# Patient Record
Sex: Male | Born: 1960 | Race: Black or African American | Hispanic: No | Marital: Single | Smoking: Current every day smoker
Health system: Southern US, Community
[De-identification: ages and names within clinical notes are randomized; demographics above are authoritative.]

## PROBLEM LIST (undated history)

## (undated) DIAGNOSIS — I10 Essential (primary) hypertension: Secondary | ICD-10-CM

## (undated) DIAGNOSIS — E785 Hyperlipidemia, unspecified: Secondary | ICD-10-CM

---

## 2016-08-01 ENCOUNTER — Telehealth: Payer: Self-pay

## 2016-08-01 NOTE — Telephone Encounter (Signed)
See separate note.

## 2016-08-01 NOTE — Telephone Encounter (Signed)
Pt received triage letter from [facility] and isn't having any GI issues, no blood thinners or history of heart attacks. Please call [phone]

## 2016-08-01 NOTE — Telephone Encounter (Signed)
[phone] RECEIVED LETTER TO SCHEDULE TCS

## 2016-08-02 ENCOUNTER — Telehealth: Payer: Self-pay

## 2016-08-02 NOTE — Telephone Encounter (Signed)
LMOM to call.

## 2016-08-02 NOTE — Telephone Encounter (Signed)
See separate triage.  

## 2016-08-08 NOTE — Telephone Encounter (Signed)
Gastroenterology Pre-Procedure Review  Request Date: 08/02/2016 Requesting Physician: Barry Dienes FNP  PATIENT REVIEW QUESTIONS: The patient responded to the following health history questions as indicated:    1. Diabetes Melitis: no 2. Joint replacements in the past 12 months: no 3. Major health problems in the past 3 months: no 4. Has an artificial valve or MVP: no 5. Has a defibrillator: no 6. Has been advised in past to take antibiotics in advance of a procedure like teeth cleaning: no 7. Family history of colon cancer: no  8. Alcohol Use: YES    6 pack a week 9. History of sleep apnea: no  10. History of coronary artery or other vascular stents placed within the last 12 months: no    MEDICATIONS & ALLERGIES:    Patient reports the following regarding taking any blood thinners:   Plavix? no Aspirin? no Coumadin? no Brilinta? no Xarelto? no Eliquis? no Pradaxa? no Savaysa? no Effient? no  Patient confirms/reports the following medications:  Current Outpatient Prescriptions  Medication Sig Dispense Refill  . losartan (COZAAR) 100 MG tablet Take 100 mg by mouth daily.    . rosuvastatin (CRESTOR) 10 MG tablet Take 10 mg by mouth daily.     No current facility-administered medications for this visit.     Patient confirms/reports the following allergies:  No Known Allergies  No orders of the defined types were placed in this encounter.   AUTHORIZATION INFORMATION Primary Insurance:   ID #: Group #:  Pre-Cert / Auth required:  Pre-Cert / Auth #:   Secondary Insurance:   ID #:   Group #:  Pre-Cert / Auth required:  Pre-Cert / Auth #:   SCHEDULE INFORMATION: Procedure has been scheduled as follows:  Date:  09/20/2016                Time:  10:30 AM Location: [facility] Short Stay  This Gastroenterology Pre-Precedure Review Form is being routed to the following provider(s): Barney Drain, MD

## 2016-08-08 NOTE — Telephone Encounter (Signed)
Cleanpiq SAMPLE-FULL LIQUIDS WITH BREAKFAST.  Full Liquid Diet A high-calorie, high-protein supplement should be used to meet your nutritional requirements when the full liquid diet is continued for more than 2 or 3 days. If this diet is to be used for an extended period of time (more than 7 days), a multivitamin should be considered.  Breads and Starches  Allowed: None are allowed   Avoid: Any others.    Potatoes/Pasta/Rice  Allowed: ANY ITEM AS A SOUP OR SMALL PLATE OF MASHED POTATOES OR SCRAMBLED EGGS. (DO NOT EAT MORE THAN ONE SERVING ON THE DAY BEFORE COLONOSCOPY).      Vegetables  Allowed: Strained tomato or vegetable juice. Vegetables pureed in soup.   Avoid: Any others.    Fruit  Allowed: Any strained fruit juices and fruit drinks. Include 1 serving of citrus or vitamin C-enriched fruit juice daily.   Avoid: Any others.  Meat and Meat Substitutes  Allowed: Egg  Avoid: Any meat, fish, or fowl. All cheese.  Milk  Allowed: SOY Milk beverages, including milk shakes and instant breakfast mixes. Smooth yogurt.   Avoid: Any others. Avoid dairy products if not tolerated.    Soups and Combination Foods  Allowed: Broth, strained cream soups. Strained, broth-based soups.   Avoid: Any others.    Desserts and Sweets  Allowed: flavored gelatin, tapioca, ice cream, sherbet, smooth pudding, junket, fruit ices, frozen ice pops, pudding pops, frozen fudge pops, chocolate syrup. Sugar, honey, jelly, syrup.   Avoid: Any others.  Fats and Oils  Allowed: Margarine, butter, cream, sour cream, oils.   Avoid: Any others.  Beverages  Allowed: All.   Avoid: None.  Condiments  Allowed: Iodized salt, pepper, spices, flavorings. Cocoa powder.   Avoid: Any others.    SAMPLE MEAL PLAN Breakfast   cup orange juice.   1 OR 2 EGGS  1 cup milk.   1 cup beverage (coffee or tea).   Cream or sugar, if desired.    Midmorning Snack  2 SCRAMBLED OR HARD BOILED  EGG   Lunch  1 cup cream soup.    cup fruit juice.   1 cup milk.    cup custard.   1 cup beverage (coffee or tea).   Cream or sugar, if desired.    Midafternoon Snack  1 cup milk shake.  Dinner  1 cup cream soup.    cup fruit juice.   1 cup MILK    cup pudding.   1 cup beverage (coffee or tea).   Cream or sugar, if desired.  Evening Snack  1 cup supplement.  To increase calories, add sugar, cream, butter, or margarine if possible. Nutritional supplements will also increase the total calories.

## 2016-08-15 ENCOUNTER — Other Ambulatory Visit: Payer: Self-pay

## 2016-08-15 DIAGNOSIS — Z1211 Encounter for screening for malignant neoplasm of colon: Secondary | ICD-10-CM

## 2016-08-15 NOTE — Telephone Encounter (Signed)
Sample and instructions ready for pt.

## 2016-08-15 NOTE — Telephone Encounter (Signed)
Pt is aware and will pick up the prep and instructions here. They are at front for him when he comes.

## 2016-09-20 ENCOUNTER — Encounter (HOSPITAL_COMMUNITY)
Admission: RE | Disposition: A | Discharge status: Home / Self Care | Payer: Self-pay | Source: Ambulatory Visit | Attending: Gastroenterology

## 2016-09-20 ENCOUNTER — Encounter (HOSPITAL_COMMUNITY): Payer: Self-pay | Admitting: *Deleted

## 2016-09-20 ENCOUNTER — Ambulatory Visit (HOSPITAL_COMMUNITY)
Admission: RE | Admit: 2016-09-20 | Discharge: 2016-09-20 | Disposition: A | Discharge status: Home / Self Care | Payer: BLUE CROSS/BLUE SHIELD | Source: Ambulatory Visit | Attending: Gastroenterology | Admitting: Gastroenterology

## 2016-09-20 DIAGNOSIS — D128 Benign neoplasm of rectum: Secondary | ICD-10-CM | POA: Diagnosis not present

## 2016-09-20 DIAGNOSIS — D124 Benign neoplasm of descending colon: Secondary | ICD-10-CM | POA: Insufficient documentation

## 2016-09-20 DIAGNOSIS — D123 Benign neoplasm of transverse colon: Secondary | ICD-10-CM | POA: Insufficient documentation

## 2016-09-20 DIAGNOSIS — F17210 Nicotine dependence, cigarettes, uncomplicated: Secondary | ICD-10-CM | POA: Diagnosis not present

## 2016-09-20 DIAGNOSIS — D125 Benign neoplasm of sigmoid colon: Secondary | ICD-10-CM | POA: Insufficient documentation

## 2016-09-20 DIAGNOSIS — Z1212 Encounter for screening for malignant neoplasm of rectum: Secondary | ICD-10-CM | POA: Diagnosis not present

## 2016-09-20 DIAGNOSIS — K644 Residual hemorrhoidal skin tags: Secondary | ICD-10-CM | POA: Insufficient documentation

## 2016-09-20 DIAGNOSIS — K648 Other hemorrhoids: Secondary | ICD-10-CM | POA: Diagnosis not present

## 2016-09-20 DIAGNOSIS — E785 Hyperlipidemia, unspecified: Secondary | ICD-10-CM | POA: Insufficient documentation

## 2016-09-20 DIAGNOSIS — Z1211 Encounter for screening for malignant neoplasm of colon: Secondary | ICD-10-CM | POA: Diagnosis not present

## 2016-09-20 DIAGNOSIS — D122 Benign neoplasm of ascending colon: Secondary | ICD-10-CM | POA: Diagnosis not present

## 2016-09-20 DIAGNOSIS — Z79899 Other long term (current) drug therapy: Secondary | ICD-10-CM | POA: Insufficient documentation

## 2016-09-20 DIAGNOSIS — I10 Essential (primary) hypertension: Secondary | ICD-10-CM | POA: Insufficient documentation

## 2016-09-20 HISTORY — PX: POLYPECTOMY: SHX5525

## 2016-09-20 HISTORY — DX: Essential (primary) hypertension: I10

## 2016-09-20 HISTORY — DX: Hyperlipidemia, unspecified: E78.5

## 2016-09-20 HISTORY — PX: COLONOSCOPY: SHX5424

## 2016-09-20 SURGERY — COLONOSCOPY
Anesthesia: Moderate Sedation

## 2016-09-20 MED ORDER — MEPERIDINE HCL 100 MG/ML IJ SOLN
INTRAMUSCULAR | Status: DC | PRN
  Administered 2016-09-20 (×3): 25 mg via INTRAVENOUS

## 2016-09-20 MED ORDER — STERILE WATER FOR IRRIGATION IR SOLN
Status: DC | PRN
  Administered 2016-09-20: 11:00:00

## 2016-09-20 MED ORDER — MIDAZOLAM HCL 5 MG/5ML IJ SOLN
INTRAMUSCULAR | Status: AC
  Filled 2016-09-20: qty 10

## 2016-09-20 MED ORDER — MIDAZOLAM HCL 5 MG/5ML IJ SOLN
INTRAMUSCULAR | Status: DC | PRN
  Administered 2016-09-20: 2 mg via INTRAVENOUS
  Administered 2016-09-20: 1 mg via INTRAVENOUS
  Administered 2016-09-20: 2 mg via INTRAVENOUS

## 2016-09-20 MED ORDER — MEPERIDINE HCL 100 MG/ML IJ SOLN
INTRAMUSCULAR | Status: DC
Start: 2016-09-20 — End: 2016-09-20
  Filled 2016-09-20: qty 2

## 2016-09-20 MED ORDER — SODIUM CHLORIDE 0.9 % IV SOLN
INTRAVENOUS | Status: DC
  Administered 2016-09-20: 1000 mL via INTRAVENOUS

## 2016-09-20 NOTE — Op Note (Signed)
[facility],The Patient Name: Ronnie Chapman Procedure Date: 09/20/2016 10:14 AM MRN: [id number] Date of Birth: [birth year] Attending MD: Barney Drain , MD CSN: [id number] Age: 56 Admit Type: Outpatient Procedure:                Colonoscopy with SNARE CAUTERY POLYPECTOMY Indications:              Screening for colorectal malignant neoplasm Providers:                Barney Drain, MD, Otis Peak B. Gwenlyn Perking RN, RN, Aram Candela Referring MD:              Medicines:                Meperidine 75 mg IV, Midazolam 5 mg IV Complications:            No immediate complications. Estimated Blood Loss:     Estimated blood loss: none. Procedure:                Pre-Anesthesia Assessment:                           - Prior to the procedure, a History and Physical                            was performed, and patient medications and                            allergies were reviewed. The patient's tolerance of                            previous anesthesia was also reviewed. The risks                            and benefits of the procedure and the sedation                            options and risks were discussed with the patient.                            All questions were answered, and informed consent                            was obtained. Prior Anticoagulants: The patient has                            taken no previous anticoagulant or antiplatelet                            agents. ASA Grade Assessment: II - A patient with                            mild systemic disease. After reviewing the risks  and benefits, the patient was deemed in                            satisfactory condition to undergo the procedure.                            After obtaining informed consent, the colonoscope                            was passed under direct vision. Throughout the                            procedure, the patient's blood pressure, pulse, and                       oxygen saturations were monitored continuously. The                            [id number]) was introduced through the anus                            and advanced to the the cecum, identified by                            appendiceal orifice and ileocecal valve. The                            colonoscopy was technically difficult and complex                            due to a tortuous colon. Successful completion of                            the procedure was aided by COLOWRAP. The patient                            tolerated the procedure well. The quality of the                            bowel preparation was good. The ileocecal valve,                            appendiceal orifice, and rectum were photographed. Scope In: 10:43:24 AM Scope Out: 11:02:49 AM Scope Withdrawal Time: 0 hours 17 minutes 35 seconds  Total Procedure Duration: 0 hours 19 minutes 25 seconds  Findings:      Six sessile polyps were found in the rectum, sigmoid colon, descending       colon, distal transverse colon and ascending colon. The polyps were 4 to       6 mm in size. These polyps were removed with a hot snare. Resection and       retrieval were complete.      A 10 mm polyp was found in the proximal transverse colon. The polyp was       sessile. The polyp was removed with a piecemeal  technique using a hot       snare. Resection and retrieval were complete.      External and internal hemorrhoids were found during retroflexion. The       hemorrhoids were small. Impression:               - Six 4 to 6 mm polyps in the rectum, in the                            sigmoid colon, in the descending colon, in the                            distal transverse colon and in the ascending colon,                            removed with a hot snare. Resected and retrieved.                           - One 10 mm polyp in the proximal transverse colon,                            removed piecemeal  using a hot snare. Resected and                            retrieved.                           - External and internal hemorrhoids. Moderate Sedation:      Moderate (conscious) sedation was administered by the endoscopy nurse       and supervised by the endoscopist. The following parameters were       monitored: oxygen saturation, heart rate, blood pressure, and response       to care. Total physician intraservice time was 29 minutes. Recommendation:           - Repeat colonoscopy 1 YEAR IF ADVANCED IN pTC AND                            3 YEARS IF SIMPLE ADENOMAS for surveillance.                           - High fiber diet.                           - Continue present medications.                           - Await pathology results.                           - Patient has a contact number available for                            emergencies. The signs and symptoms of potential  delayed complications were discussed with the                            patient. Return to normal activities tomorrow.                            Written discharge instructions were provided to the                            patient. Procedure Code(s):        --- Professional ---                           [phone], Colonoscopy, flexible; with removal of                            tumor(s), polyp(s), or other lesion(s) by snare                            technique                           99152, Moderate sedation services provided by the                            same physician or other qualified health care                            professional performing the diagnostic or                            therapeutic service that the sedation supports,                            requiring the presence of an independent trained                            observer to assist in the monitoring of the                            patient's level of consciousness and physiological                             status; initial 15 minutes of intraservice time,                            patient age 42 years or older                           [phone], Moderate sedation services; each additional                            15 minutes intraservice time Diagnosis Code(s):        --- Professional ---  Z12.11, Encounter for screening for malignant                            neoplasm of colon                           K62.1, Rectal polyp                           D12.5, Benign neoplasm of sigmoid colon                           D12.4, Benign neoplasm of descending colon                           D12.3, Benign neoplasm of transverse colon (hepatic                            flexure or splenic flexure)                           D12.2, Benign neoplasm of ascending colon                           K64.8, Other hemorrhoids CPT copyright 2016 [employer]. All rights reserved. The codes documented in this report are preliminary and upon coder review may  be revised to meet current compliance requirements. Barney Drain, MD Barney Drain, MD 09/20/2016 11:18:52 AM This report has been signed electronically. Number of Addenda: 0

## 2016-09-20 NOTE — H&P (Signed)
  Primary Care Physician:  Patient, No Pcp Per Primary Gastroenterologist:  Dr. Oneida Alar  Pre-Procedure History & Physical: HPI:  Ronnie Chapman is a 56 y.o. male here for COLON CANCER SCREENING.  Past Medical History:  Diagnosis Date  . Hyperlipidemia   . Hypertension     History reviewed. No pertinent surgical history.  Prior to Admission medications   Medication Sig Start Date End Date Taking? Authorizing Provider  amLODipine (NORVASC) 10 MG tablet Take 10 mg by mouth daily. 08/11/16  Yes [provider]  losartan (COZAAR) 100 MG tablet Take 100 mg by mouth daily.   Yes [provider]  rosuvastatin (CRESTOR) 10 MG tablet Take 10 mg by mouth daily.   Yes [provider]    Allergies as of 08/15/2016  . (No Known Allergies)    Family History  Problem Relation Age of Onset  . Hyperlipidemia Mother     Social History   Social History  . Marital status: Single    Spouse name: N/A  . Number of children: N/A  . Years of education: N/A   Occupational History  . Not on file.   Social History Main Topics  . Smoking status: Current Every Day Smoker    Packs/day: 0.50    Years: 30.00    Types: Cigarettes  . Smokeless tobacco: Never Used  . Alcohol use Yes     Comment: drinks beer and Gin - once a week  . Drug use: No  . Sexual activity: Not on file   Other Topics Concern  . Not on file   Social History Narrative  . No narrative on file    Review of Systems: See HPI, otherwise negative ROS   Physical Exam: BP (!) 161/100   Pulse 81   Temp 98.6 F (37 C) (Oral)   Resp 16   Ht 6' (1.829 m)   Wt 240 lb (108.9 kg)   SpO2 98%   BMI 32.55 kg/m  General:   Alert,  pleasant and cooperative in NAD Head:  Normocephalic and atraumatic. Neck:  Supple; Lungs:  Clear throughout to auscultation.    Heart:  Regular rate and rhythm. Abdomen:  Soft, nontender and nondistended. Normal bowel sounds, without guarding, and without rebound.    Neurologic:  Alert and  oriented x4;  grossly normal neurologically.  Impression/Plan:     SCREENING  Plan:  1. TCS TODAY. DISCUSSED PROCEDURE, BENEFITS, & RISKS: < 1% chance of medication reaction, bleeding, perforation, or rupture of spleen/liver.

## 2016-09-20 NOTE — Discharge Instructions (Signed)
You had 7 polyps removed. You have internal hemorrhoids.   CONTINUE YOUR WEIGHT LOSS EFFORTS.WHILE I DO NOT WANT TO ALARM YOU, YOUR BODY MASS INDEX IS OVER 30 WHICH MEANS YOU ARE OBESE. OBESITY ACTIVATES CANCER GENES. OBESITY IS ASSOCIATED WITH AN INCREASE RISK FOR ALL CANCERS, INCLUDING ESOPHAGEAL AND COLON CANCER.  YOU SHOULD LOSE TWENTY POUNDS. YOUR GOAL WEIGHT SHOULD BE 220 LBS.   DRINK WATER TO KEEP YOUR URINE LIGHT YELLOW.  FOLLOW A HIGH FIBER DIET. AVOID ITEMS THAT CAUSE BLOATING & GAS. SEE INFO BELOW.  YOUR BIOPSY RESULTS WILL BE AVAILABLE IN MY CHART AFTER JUL 1 AND MY OFFICE WILL CONTACT YOU IN 10-14 DAYS WITH YOUR RESULTS.   Next colonoscopy in 1-3 years.  YOUR SISTERS, BROTHERS, CHILDREN, AND PARENTS NEED TO HAVE A COLONOSCOPY STARTING AT THE AGE OF 40.    Colonoscopy Care After Read the instructions outlined below and refer to this sheet in the next week. These discharge instructions provide you with general information on caring for yourself after you leave the hospital. While your treatment has been planned according to the most current medical practices available, unavoidable complications occasionally occur. If you have any problems or questions after discharge, call DR. Sayan Aldava, [phone].  ACTIVITY  You may resume your regular activity, but move at a slower pace for the next 24 hours.   Take frequent rest periods for the next 24 hours.   Walking will help get rid of the air and reduce the bloated feeling in your belly (abdomen).   No driving for 24 hours (because of the medicine (anesthesia) used during the test).   You may shower.   Do not sign any important legal documents or operate any machinery for 24 hours (because of the anesthesia used during the test).    NUTRITION  Drink plenty of fluids.   You may resume your normal diet as instructed by your doctor.   Begin with a light meal and progress to your normal diet. Heavy or fried foods are harder to  digest and may make you feel sick to your stomach (nauseated).   Avoid alcoholic beverages for 24 hours or as instructed.    MEDICATIONS  You may resume your normal medications.   WHAT YOU CAN EXPECT TODAY  Some feelings of bloating in the abdomen.   Passage of more gas than usual.   Spotting of blood in your stool or on the toilet paper  .  IF YOU HAD POLYPS REMOVED DURING THE COLONOSCOPY:  Eat a soft diet IF YOU HAVE NAUSEA, BLOATING, ABDOMINAL PAIN, OR VOMITING.    FINDING OUT THE RESULTS OF YOUR TEST Not all test results are available during your visit. DR. Oneida Alar WILL CALL YOU WITHIN 14 DAYS OF YOUR PROCEDUE WITH YOUR RESULTS. Do not assume everything is normal if you have not heard from DR. Kelis Plasse, CALL HER OFFICE AT [phone].  SEEK IMMEDIATE MEDICAL ATTENTION AND CALL THE OFFICE: [phone] IF:  You have more than a spotting of blood in your stool.   Your belly is swollen (abdominal distention).   You are nauseated or vomiting.   You have a temperature over 101F.   You have abdominal pain or discomfort that is severe or gets worse throughout the day.   High-Fiber Diet A high-fiber diet changes your normal diet to include more whole grains, legumes, fruits, and vegetables. Changes in the diet involve replacing refined carbohydrates with unrefined foods. The calorie level of the diet is essentially unchanged. The Dietary  Reference Intake (recommended amount) for adult males is 38 grams per day. For adult females, it is 25 grams per day. Pregnant and lactating women should consume 28 grams of fiber per day. Fiber is the intact part of a plant that is not broken down during digestion. Functional fiber is fiber that has been isolated from the plant to provide a beneficial effect in the body. PURPOSE  Increase stool bulk.   Ease and regulate bowel movements.   Lower cholesterol.   REDUCE RISK OF COLON CANCER  INDICATIONS THAT YOU NEED MORE  FIBER  Constipation and hemorrhoids.   Uncomplicated diverticulosis (intestine condition) and irritable bowel syndrome.   Weight management.   As a protective measure against hardening of the arteries (atherosclerosis), diabetes, and cancer.   GUIDELINES FOR INCREASING FIBER IN THE DIET  Start adding fiber to the diet slowly. A gradual increase of about 5 more grams (2 slices of whole-wheat bread, 2 servings of most fruits or vegetables, or 1 bowl of high-fiber cereal) per day is best. Too rapid an increase in fiber may result in constipation, flatulence, and bloating.   Drink enough water and fluids to keep your urine clear or pale yellow. Water, juice, or caffeine-free drinks are recommended. Not drinking enough fluid may cause constipation.   Eat a variety of high-fiber foods rather than one type of fiber.   Try to increase your intake of fiber through using high-fiber foods rather than fiber pills or supplements that contain small amounts of fiber.   The goal is to change the types of food eaten. Do not supplement your present diet with high-fiber foods, but replace foods in your present diet.   INCLUDE A VARIETY OF FIBER SOURCES  Replace refined and processed grains with whole grains, canned fruits with fresh fruits, and incorporate other fiber sources. White rice, white breads, and most bakery goods contain little or no fiber.   Brown whole-grain rice, buckwheat oats, and many fruits and vegetables are all good sources of fiber. These include: broccoli, Brussels sprouts, cabbage, cauliflower, beets, sweet potatoes, white potatoes (skin on), carrots, tomatoes, eggplant, squash, berries, fresh fruits, and dried fruits.   Cereals appear to be the richest source of fiber. Cereal fiber is found in whole grains and bran. Bran is the fiber-rich outer coat of cereal grain, which is largely removed in refining. In whole-grain cereals, the bran remains. In breakfast cereals, the largest  amount of fiber is found in those with "bran" in their names. The fiber content is sometimes indicated on the label.   You may need to include additional fruits and vegetables each day.   In baking, for 1 cup white flour, you may use the following substitutions:   1 cup whole-wheat flour minus 2 tablespoons.   1/2 cup white flour plus 1/2 cup whole-wheat flour.   Polyps, Colon  A polyp is extra tissue that grows inside your body. Colon polyps grow in the large intestine. The large intestine, also called the colon, is part of your digestive system. It is a long, hollow tube at the end of your digestive tract where your body makes and stores stool. Most polyps are not dangerous. They are benign. This means they are not cancerous. But over time, some types of polyps can turn into cancer. Polyps that are smaller than a pea are usually not harmful. But larger polyps could someday become or may already be cancerous. To be safe, doctors remove all polyps and test them.   WHO  GETS POLYPS? Anyone can get polyps, but certain people are more likely than others. You may have a greater chance of getting polyps if:  You are over 50.   You have had polyps before.   Someone in your family has had polyps.   Someone in your family has had cancer of the large intestine.   Find out if someone in your family has had polyps. You may also be more likely to get polyps if you:   Eat a lot of fatty foods   Smoke   Drink alcohol   Do not exercise  Eat too much   PREVENTION There is not one sure way to prevent polyps. You might be able to lower your risk of getting them if you:  Eat more fruits and vegetables and less fatty food.   Do not smoke.   Avoid alcohol.   Exercise every day.   Lose weight if you are overweight.   Eating more calcium and folate can also lower your risk of getting polyps. Some foods that are rich in calcium are milk, cheese, and broccoli. Some foods that are rich in folate  are chickpeas, kidney beans, and spinach.   Hemorrhoids Hemorrhoids are dilated (enlarged) veins around the rectum. Sometimes clots will form in the veins. This makes them swollen and painful. These are called thrombosed hemorrhoids. Causes of hemorrhoids include:  Constipation.   Straining to have a bowel movement.   HEAVY LIFTING  HOME CARE INSTRUCTIONS  Eat a well balanced diet and drink 6 to 8 glasses of water every day to avoid constipation. You may also use a bulk laxative.   Avoid straining to have bowel movements.   Keep anal area dry and clean.   Do not use a donut shaped pillow or sit on the toilet for long periods. This increases blood pooling and pain.   Move your bowels when your body has the urge; this will require less straining and will decrease pain and pressure.

## 2016-09-22 ENCOUNTER — Encounter (HOSPITAL_COMMUNITY): Payer: Self-pay | Admitting: Gastroenterology

## 2016-09-30 ENCOUNTER — Telehealth: Payer: Self-pay | Admitting: Gastroenterology

## 2016-09-30 NOTE — Telephone Encounter (Signed)
Please call pt. HE had simple adenomas removed.. FOLLOW A HIGH FIBER DIET. NEXT TCS IN 3 YEARS. ALL SISTERS, BROTHERS, CHILDREN, AND PARENTS NEED TO HAVE A COLONOSCOPY STARTING AT THE AGE OF 40.

## 2016-10-02 NOTE — Telephone Encounter (Signed)
ON RECALL  °

## 2016-10-02 NOTE — Telephone Encounter (Signed)
Tried to call with no answer  

## 2016-10-03 NOTE — Telephone Encounter (Signed)
Pt is aware results  

## 2017-05-06 ENCOUNTER — Emergency Department (HOSPITAL_COMMUNITY)
Admission: EM | Admit: 2017-05-06 | Discharge: 2017-05-06 | Disposition: A | Discharge status: Home / Self Care | Payer: BLUE CROSS/BLUE SHIELD | Attending: Emergency Medicine | Admitting: Emergency Medicine

## 2017-05-06 ENCOUNTER — Encounter (HOSPITAL_COMMUNITY): Payer: Self-pay | Admitting: Emergency Medicine

## 2017-05-06 ENCOUNTER — Other Ambulatory Visit: Payer: Self-pay

## 2017-05-06 ENCOUNTER — Emergency Department (HOSPITAL_COMMUNITY): Payer: BLUE CROSS/BLUE SHIELD

## 2017-05-06 DIAGNOSIS — I10 Essential (primary) hypertension: Secondary | ICD-10-CM | POA: Insufficient documentation

## 2017-05-06 DIAGNOSIS — R69 Illness, unspecified: Secondary | ICD-10-CM

## 2017-05-06 DIAGNOSIS — J111 Influenza due to unidentified influenza virus with other respiratory manifestations: Secondary | ICD-10-CM | POA: Diagnosis not present

## 2017-05-06 DIAGNOSIS — Z79899 Other long term (current) drug therapy: Secondary | ICD-10-CM | POA: Insufficient documentation

## 2017-05-06 DIAGNOSIS — F17210 Nicotine dependence, cigarettes, uncomplicated: Secondary | ICD-10-CM | POA: Diagnosis not present

## 2017-05-06 DIAGNOSIS — R05 Cough: Secondary | ICD-10-CM | POA: Diagnosis present

## 2017-05-06 LAB — CBC
HEMATOCRIT: 44.1 % (ref 39.0–52.0)
HEMOGLOBIN: 14.6 g/dL (ref 13.0–17.0)
MCH: 31.1 pg (ref 26.0–34.0)
MCHC: 33.1 g/dL (ref 30.0–36.0)
MCV: 93.8 fL (ref 78.0–100.0)
Platelets: 196 10*3/uL (ref 150–400)
RBC: 4.7 MIL/uL (ref 4.22–5.81)
RDW: 13.4 % (ref 11.5–15.5)
WBC: 4.1 10*3/uL (ref 4.0–10.5)

## 2017-05-06 LAB — COMPREHENSIVE METABOLIC PANEL
ALBUMIN: 4.2 g/dL (ref 3.5–5.0)
ALK PHOS: 45 U/L (ref 38–126)
ALT: 30 U/L (ref 17–63)
ANION GAP: 11 (ref 5–15)
AST: 39 U/L (ref 15–41)
BUN: 20 mg/dL (ref 6–20)
CALCIUM: 9 mg/dL (ref 8.9–10.3)
CHLORIDE: 103 mmol/L (ref 101–111)
CO2: 23 mmol/L (ref 22–32)
Creatinine, Ser: 1.47 mg/dL — ABNORMAL HIGH (ref 0.61–1.24)
GFR calc non Af Amer: 51 mL/min — ABNORMAL LOW (ref >60)
GFR, EST AFRICAN AMERICAN: 59 mL/min — AB (ref >60)
Glucose, Bld: 99 mg/dL (ref 65–99)
Potassium: 3.7 mmol/L (ref 3.5–5.1)
SODIUM: 137 mmol/L (ref 135–145)
Total Bilirubin: 0.5 mg/dL (ref 0.3–1.2)
Total Protein: 8 g/dL (ref 6.5–8.1)

## 2017-05-06 LAB — LIPASE, BLOOD: LIPASE: 28 U/L (ref 11–51)

## 2017-05-06 MED ORDER — CHLORPHENIRAMINE-PHENYLEPHRINE 1-3.5 MG/ML PO LIQD: 0.7500 mL | Freq: Four times a day (QID) | ORAL | 0 refills | Status: DC | PRN

## 2017-05-06 MED ORDER — OSELTAMIVIR PHOSPHATE 75 MG PO CAPS: 75.0000 mg | ORAL_CAPSULE | Freq: Two times a day (BID) | ORAL | 0 refills | Status: DC

## 2017-05-06 MED ORDER — OSELTAMIVIR PHOSPHATE 75 MG PO CAPS
75.0000 mg | ORAL_CAPSULE | Freq: Once | ORAL | Status: AC
  Administered 2017-05-06: 75 mg via ORAL
  Filled 2017-05-06: qty 1

## 2017-05-06 MED ORDER — HYDROCOD POLST-CPM POLST ER 10-8 MG/5ML PO SUER
5.0000 mL | Freq: Once | ORAL | Status: AC
  Administered 2017-05-06: 5 mL via ORAL
  Filled 2017-05-06: qty 5

## 2017-05-06 NOTE — ED Triage Notes (Signed)
Pt reports began having cough and congestion Friday, began having D/ yesterday. Has not ate in two days per pt. 1 episode of D/ today, no bloody or tarry stools, no V/. No OTC medicines today.

## 2017-05-06 NOTE — ED Provider Notes (Signed)
[facility] EMERGENCY DEPARTMENT Provider Note   CSN: [id number] Arrival date & time: 05/06/17  1010     History   Chief Complaint Chief Complaint  Patient presents with  . Diarrhea    HPI YAMEN CASTROGIOVANNI is a 57 y.o. male.  HPI Presents with concern of cough, generalized discomfort, subjective fever, and 2 loose bowel movements. He was generally well until about 36 hours ago, when illness began. Since that time he has had persistent cough, abdominal soreness with coughing, and general fatigue. Yesterday, and again today he had a loose bowel movement, but no sustained diarrhea, no vomiting. There is some nausea. Minimal relief with OTC medication.   Patient does smoke cigarettes  Smoking cessation provided, particularly in light of this patient's evaluation in the ED.   Past Medical History:  Diagnosis Date  . Hyperlipidemia   . Hypertension     Patient Active Problem List   Diagnosis Date Noted  . Special screening for malignant neoplasms, colon     Past Surgical History:  Procedure Laterality Date  . COLONOSCOPY N/A 09/20/2016   Procedure: COLONOSCOPY;  Surgeon: Danie Binder, MD;  Location: [facility];  Service: Endoscopy;  Laterality: N/A;  10:30 AM  . POLYPECTOMY  09/20/2016   Procedure: POLYPECTOMY;  Surgeon: Danie Binder, MD;  Location: [facility];  Service: Endoscopy;;  colon       Home Medications    Prior to Admission medications   Medication Sig Start Date End Date Taking? Authorizing Provider  amLODipine (NORVASC) 10 MG tablet Take 10 mg by mouth daily. 08/11/16  Yes [provider]  losartan-hydrochlorothiazide (HYZAAR) 100-25 MG tablet Take 1 tablet by mouth daily.   Yes [provider]  rosuvastatin (CRESTOR) 10 MG tablet Take 10 mg by mouth daily.   Yes [provider]  losartan (COZAAR) 100 MG tablet Take 100 mg by mouth daily.    [provider]    Family History Family History  Problem  Relation Age of Onset  . Hyperlipidemia Mother     Social History Social History   Tobacco Use  . Smoking status: Current Every Day Smoker    Packs/day: 0.50    Years: 30.00    Pack years: 15.00    Types: Cigarettes  . Smokeless tobacco: Never Used  Substance Use Topics  . Alcohol use: Yes    Comment: drinks beer and Gin - once a week  . Drug use: No     Allergies   Patient has no known allergies.   Review of Systems Review of Systems  Constitutional:       Per HPI, otherwise negative  HENT:       Per HPI, otherwise negative  Respiratory:       Per HPI, otherwise negative  Cardiovascular:       Per HPI, otherwise negative  Gastrointestinal: Positive for abdominal pain and nausea. Negative for diarrhea and vomiting.  Endocrine:       Negative aside from HPI  Genitourinary:       Neg aside from HPI   Musculoskeletal:       Per HPI, otherwise negative  Skin: Negative.   Neurological: Negative for syncope.     Physical Exam Updated Vital Signs BP 137/86 (BP Location: Left Arm)   Pulse 98   Temp 98.7 F (37.1 C) (Oral)   Resp 15   Ht 5\' 11"  (1.803 m)   Wt 99.8 kg (220 lb)   SpO2  97%   BMI 30.68 kg/m   Physical Exam  Constitutional: He is oriented to person, place, and time. He appears well-developed. No distress.  HENT:  Head: Normocephalic and atraumatic.  Eyes: Conjunctivae and EOM are normal.  Cardiovascular: Normal rate and regular rhythm.  Pulmonary/Chest: Effort normal. No stridor. No respiratory distress.  Abdominal: He exhibits no distension. There is no tenderness. There is no guarding.  Musculoskeletal: He exhibits no edema.  Neurological: He is alert and oriented to person, place, and time.  Skin: Skin is warm and dry.  Psychiatric: He has a normal mood and affect.  Nursing note and vitals reviewed.    ED Treatments / Results  Labs (all labs ordered are listed, but only abnormal results are displayed) Labs Reviewed  COMPREHENSIVE  METABOLIC PANEL - Abnormal; Notable for the following components:      Result Value   Creatinine, Ser 1.47 (*)    GFR calc non Af Amer 51 (*)    GFR calc Af Amer 59 (*)    All other components within normal limits  LIPASE, BLOOD  CBC  URINALYSIS, ROUTINE W REFLEX MICROSCOPIC    EKG  EKG Interpretation None       Radiology Dg Chest 2 View  Result Date: 05/06/2017 CLINICAL DATA:  Cough and congestion EXAM: CHEST  2 VIEW COMPARISON:  None. FINDINGS: Normal mediastinum and cardiac silhouette. Normal pulmonary vasculature. No evidence of effusion, infiltrate, or pneumothorax. No acute bony abnormality. IMPRESSION: Normal chest radiograph. Electronically Signed   By: Suzy Bouchard M.D.   On: 05/06/2017 14:43    Procedures Procedures (including critical care time)  Medications Ordered in ED Medications  oseltamivir (TAMIFLU) capsule 75 mg (not administered)  chlorpheniramine-HYDROcodone (TUSSIONEX) 10-8 MG/5ML suspension 5 mL (5 mLs Oral Given 05/06/17 1500)     Initial Impression / Assessment and Plan / ED Course  I have reviewed the triage vital signs and the nursing notes.  Pertinent labs & imaging results that were available during my care of the patient were reviewed by me and considered in my medical decision making (see chart for details).     3:20 PM Patient awake alert, in similar condition, no evidence for bacteremia, sepsis, no evidence for pneumonia. I discussed x-ray findings with him, labs.  I will suspicion for influenza, the patient will start Tamiflu. Otherwise reassuring findings, no evidence for ACS, pneumonia, bacteremia, sepsis, pulmonary embolism, the patient is appropriate for discharge with close outpatient follow-up.  Final Clinical Impressions(s) / ED Diagnoses   Final diagnoses:  Influenza-like illness    ED Discharge Orders        Ordered    oseltamivir (TAMIFLU) 75 MG capsule  Every 12 hours     05/06/17 1520     chlorpheniramine-phenylephrine (CARDEC) 1-3.5 MG/ML LIQD  4 times daily PRN     05/06/17 1520       Carmin Muskrat, MD 05/06/17 [phone]

## 2019-05-21 ENCOUNTER — Encounter: Payer: Self-pay | Admitting: *Deleted

## 2019-07-24 ENCOUNTER — Ambulatory Visit (INDEPENDENT_AMBULATORY_CARE_PROVIDER_SITE_OTHER): Payer: Self-pay | Admitting: *Deleted

## 2019-07-24 ENCOUNTER — Other Ambulatory Visit: Payer: Self-pay

## 2019-07-24 DIAGNOSIS — Z86010 Personal history of colonic polyps: Secondary | ICD-10-CM

## 2019-07-24 NOTE — Progress Notes (Signed)
Gastroenterology Pre-Procedure Review  Request Date: 07/24/2019 Requesting Physician: Beola Cord, NP, Last TCS done 09/20/2016 by Dr. Oneida Alar, tubular adenoma  PATIENT REVIEW QUESTIONS: The patient responded to the following health history questions as indicated:    1. Diabetes Melitis: no 2. Joint replacements in the past 12 months: no 3. Major health problems in the past 3 months: no 4. Has an artificial valve or MVP: no 5. Has a defibrillator: no 6. Has been advised in past to take antibiotics in advance of a procedure like teeth cleaning: no 7. Family history of colon cancer: no  8. Alcohol Use: yes, 4 beers a week, liquor on occasions 9. Illicit drug Use: no 10. History of sleep apnea: no  11. History of coronary artery or other vascular stents placed within the last 12 months: no 12. History of any prior anesthesia complications: no 13. There is no height or weight on file to calculate BMI. ht: 6'0 wt: 262 lbs    MEDICATIONS & ALLERGIES:    Patient reports the following regarding taking any blood thinners:   Plavix? no Aspirin? no Coumadin? no Brilinta? no Xarelto? no Eliquis? no Pradaxa? no Savaysa? no Effient? no  Patient confirms/reports the following medications:  Current Outpatient Medications  Medication Sig Dispense Refill  . amLODipine (NORVASC) 10 MG tablet Take 10 mg by mouth daily.  3  . losartan-hydrochlorothiazide (HYZAAR) 100-25 MG tablet Take 1 tablet by mouth daily.    . rosuvastatin (CRESTOR) 10 MG tablet Take 20 mg by mouth daily. Takes 20 mg daily.     No current facility-administered medications for this visit.    Patient confirms/reports the following allergies:  No Known Allergies  No orders of the defined types were placed in this encounter.   AUTHORIZATION INFORMATION Primary Insurance: [place],  [place] BN:[id number],  Group #: [id number] Pre-Cert / Auth required: No, file to local BCBS  SCHEDULE INFORMATION: Procedure has been  scheduled as follows:  Date: 10/03/2019, Time: 1:00 Location: [facility] with Dr. Gala Romney  This Gastroenterology Pre-Precedure Review Form is being routed to the following provider(s): Walden Field, NP

## 2019-07-24 NOTE — Progress Notes (Signed)
Ok to schedule.

## 2019-07-24 NOTE — Patient Instructions (Addendum)
[place]   Please notify us immediately if you are diabetic, take iron supplements, or if you are on coumadin or any blood thinners.   Patient Name: Ronnie Chapman Date of procedure: 10/03/2019  Time to register at [place] Stay: 12:00 pm Provider: Dr. Gala Romney  REMEMBER: COVID TEST ON 10/01/2019 AT 3:00.   Purchase: MIRALAX 238 gram bottle, 1 FLEET ENEMA, 1 box of DULCOLAX (All over the counter medications)    10/01/2019- 2 Days prior to procedure: START CLEAR LIQUID DIET AFTER YOUR LUNCH MEAL--NO SOLID FOODS!   10/02/2019- 1 Day prior to procedure:   CLEAR LIQUIDS ALL DAY--NO SOLID FOODS!   Diabetic medication adjustments for today:    At 10:00 AM, take 2 DULCOLAX '5mg'$  tablets   At 12:00 PM, Mix 5 teaspoons of Miralax in any 4-6 ounces of CLEAR LIQUIDS (Gatorade) every hour for 5 hours until passing clear, watery stools. Be sure to drink 4 ounces of clear liquid 30 minutes after each dose of Miralax.   At 3:00 PM, take 2 Dulcolax '5mg'$  tablets   If stools are not clear & watery by 6:00 PM, take 5 teaspoons of Miralax every 30 minutes until stools are clear (no color)   You must have a complete prep to ensure the most effective cleaning.   CONTINUE CLEAR LIQUIDS ONLY UNTIL MIDNIGHT. Make a conscious effort to drink as much as you can before, during & after the preparation.    NOTHING TO EAT OR DRINK AFTER MIDNIGHT except for your heart, blood pressure & breathing medications. You may take them with a sip of clear liquids.    10/03/2019- Day of Procedure  Give yourself one Fleet enema about 1 hour prior to leaving for the hospital.   Diabetic medication adjustments for today:   You may take TYLENOL products. Please continue your regular medications unless we have instructed you otherwise.    Please note, on the day of your procedure you MUST be accompanied by an adult who is willing to assume responsibility for you at time of discharge. If you do  not have such person with you, your procedure will have to be rescheduled.                                                             Please leave ALL jewelry at home prior to coming to the hospital for your procedure.   *It is your responsibility to check with your insurance company for the benefits of coverage you have for this procedure. Unfortunately, not all insurance companies have benefits to cover all or part of these types of procedures. It is your responsibility to check your benefits, however we will be glad to assist you with any codes your insurance company may need.   Please note that most insurance companies will not cover a screening colonoscopy for people under the age of 109. For example, with some insurance companies you may have benefits for a screening colonoscopy, but if polyps are found the diagnosis will change and then you may have a deductible that will need to be met. Please make sure you check your benefits for screening colonoscopy as well as a diagnostic colonoscopy.    CLEAR LIQUIDS: (NO RED)  Jello Apple Juice White Grape Juice Water  Banana popsicles Kool-Aid Coffee(No cream or milk)  Tea (No cream or milk) Soft drinks Broth (fat free beef/chicken/vegetable)   Clear liquids allow you to see your fingers on the other side of the glass. Be sure they are NOT RED in color, cloudy, but CLEAR.   Do Not Eat:  Dairy products of any kind Cranberry juice  Tomato or V8 Juice Orange Juice  Grapefruit Juice Red Grape Juice  Solid foods like cereal, oatmeal, yogurt, fruits, vegetables, creamed soups, eggs, bread, etc   HELPFUL HINTS TO MAKE DRINKING EASIER:  -Make sure prep is extremely COLD. Refrigerate the night before. You may also put in freezer.  -You may try mixing Crystal Light or Country Time Lemonade if you prefer. MIx in small amounts. Add more if necessary.  -Trying drinking through a straw.   -Rinse mouth with water or mouthwash between glasses to remove aftertaste.  -Try sipping on a cold beverage/ice popsicles between glasses of prep.  -Place a piece of sugar-free hard candy in mouth between glasses.  -If you become nauseated, try consuming smaller amounts or stretch out the time between glasses. Stop for 30 minutes to an hour & slowly start back drinking.   Call our office with any questions or concerns at [phone].   Thank You

## 2019-08-09 IMAGING — DX DG CHEST 2V
2 series · 2 of 2 positions shown · non-contrast
Comparison: None.

CLINICAL DATA: Cough and congestion

EXAM:
CHEST  2 VIEW

[chest pa]
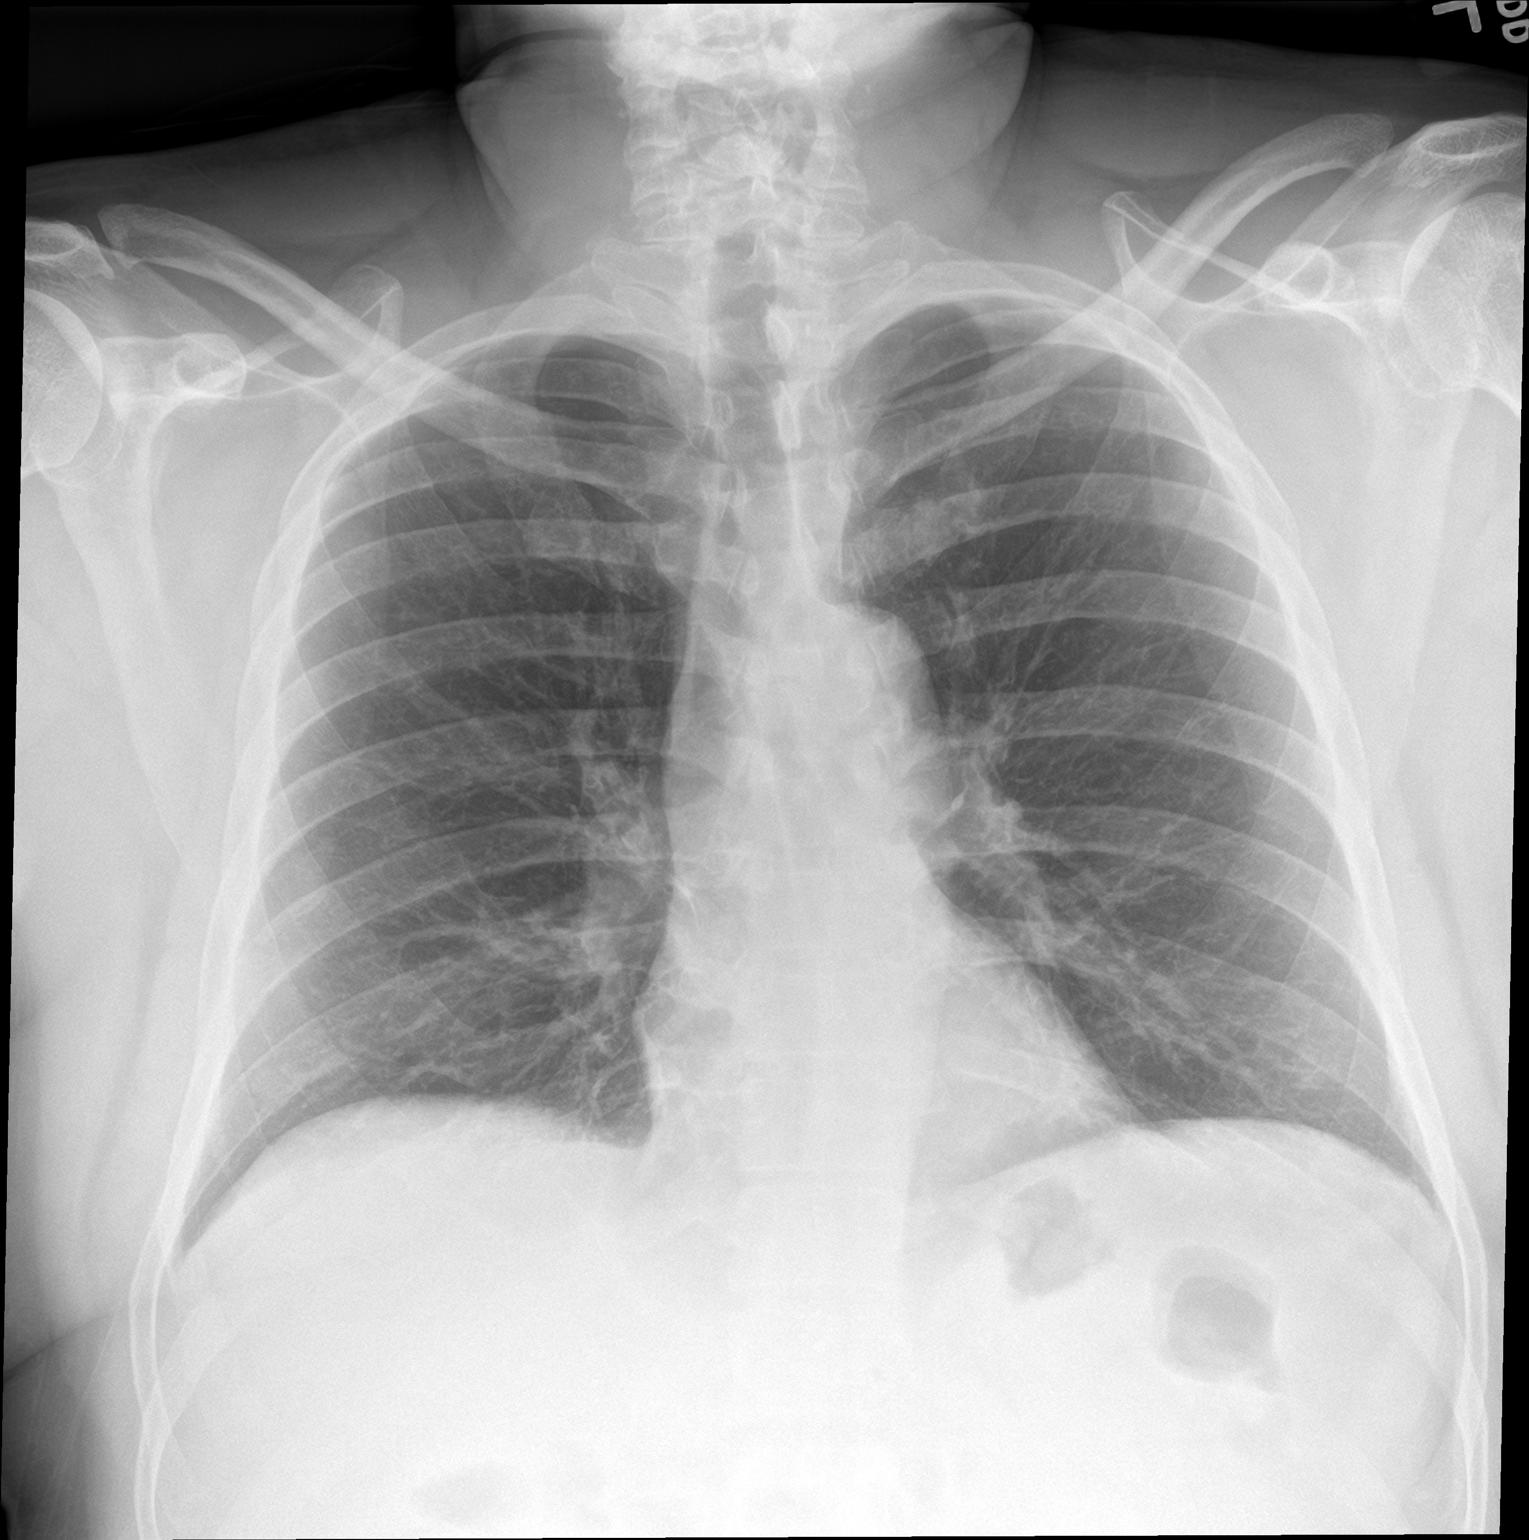

[chest lat]
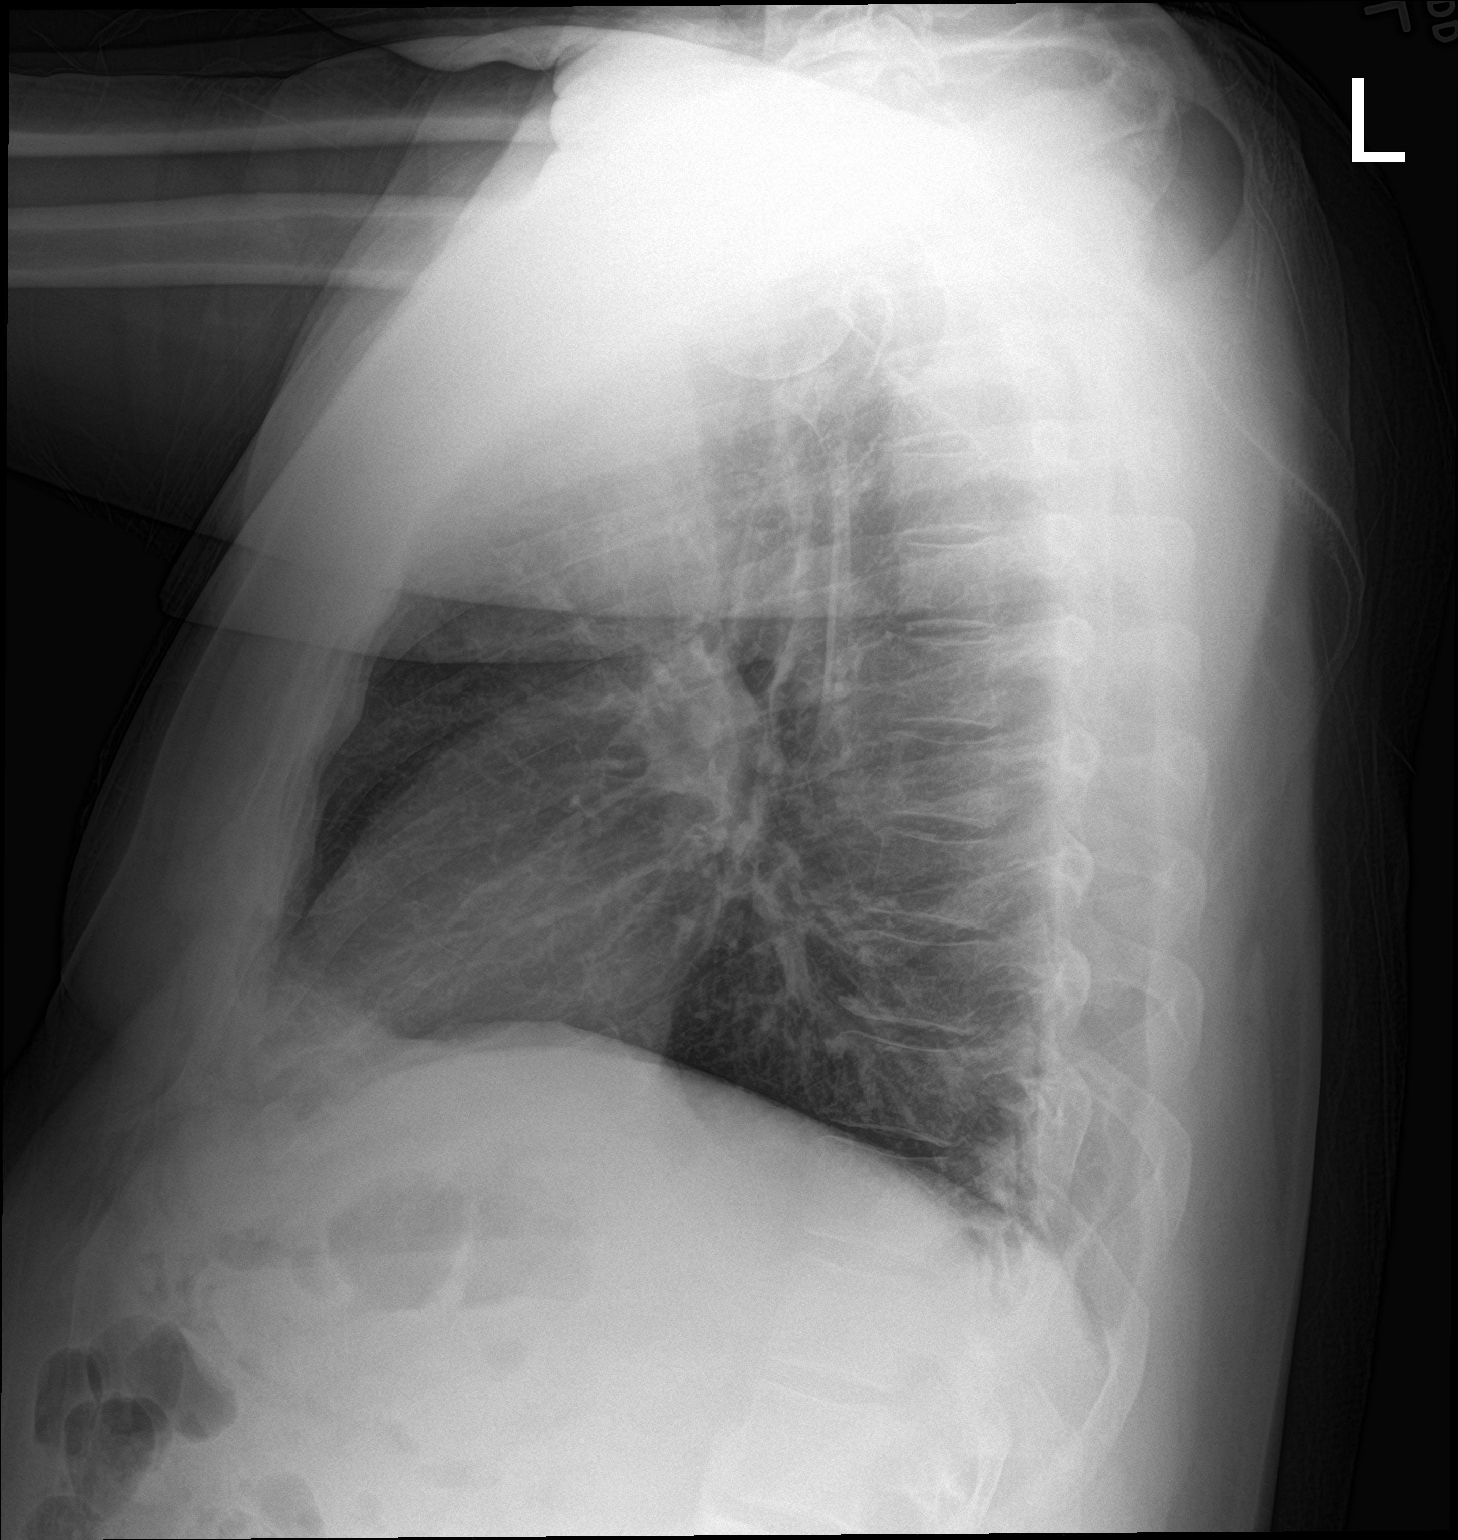

[2 of 2 positions shown; findings below may reference images not displayed]

FINDINGS: Normal mediastinum and cardiac silhouette. Normal pulmonary
vasculature. No evidence of effusion, infiltrate, or pneumothorax.
No acute bony abnormality.
IMPRESSION: Normal chest radiograph.

## 2019-10-01 ENCOUNTER — Other Ambulatory Visit (HOSPITAL_COMMUNITY)
Admission: RE | Admit: 2019-10-01 | Discharge: 2019-10-01 | Disposition: A | Discharge status: Home / Self Care | Payer: BC Managed Care – PPO | Source: Ambulatory Visit | Attending: Internal Medicine | Admitting: Internal Medicine

## 2019-10-01 ENCOUNTER — Other Ambulatory Visit: Payer: Self-pay

## 2019-10-01 DIAGNOSIS — Z20822 Contact with and (suspected) exposure to covid-19: Secondary | ICD-10-CM | POA: Insufficient documentation

## 2019-10-01 DIAGNOSIS — Z01812 Encounter for preprocedural laboratory examination: Secondary | ICD-10-CM | POA: Insufficient documentation

## 2019-10-02 LAB — SARS CORONAVIRUS 2 (TAT 6-24 HRS): SARS Coronavirus 2: NEGATIVE

## 2019-10-03 ENCOUNTER — Encounter (HOSPITAL_COMMUNITY)
Admission: RE | Disposition: A | Discharge status: Home / Self Care | Payer: Self-pay | Source: Home / Self Care | Attending: Internal Medicine

## 2019-10-03 ENCOUNTER — Other Ambulatory Visit: Payer: Self-pay

## 2019-10-03 ENCOUNTER — Encounter (HOSPITAL_COMMUNITY): Payer: Self-pay | Admitting: Internal Medicine

## 2019-10-03 ENCOUNTER — Ambulatory Visit (HOSPITAL_COMMUNITY)
Admission: RE | Admit: 2019-10-03 | Discharge: 2019-10-03 | Disposition: A | Discharge status: Home / Self Care | Payer: BC Managed Care – PPO | Attending: Internal Medicine | Admitting: Internal Medicine

## 2019-10-03 DIAGNOSIS — I10 Essential (primary) hypertension: Secondary | ICD-10-CM | POA: Insufficient documentation

## 2019-10-03 DIAGNOSIS — F17210 Nicotine dependence, cigarettes, uncomplicated: Secondary | ICD-10-CM | POA: Diagnosis not present

## 2019-10-03 DIAGNOSIS — Z86010 Personal history of colonic polyps: Secondary | ICD-10-CM | POA: Insufficient documentation

## 2019-10-03 DIAGNOSIS — Z79899 Other long term (current) drug therapy: Secondary | ICD-10-CM | POA: Diagnosis not present

## 2019-10-03 DIAGNOSIS — E785 Hyperlipidemia, unspecified: Secondary | ICD-10-CM | POA: Diagnosis not present

## 2019-10-03 DIAGNOSIS — Z1211 Encounter for screening for malignant neoplasm of colon: Secondary | ICD-10-CM | POA: Insufficient documentation

## 2019-10-03 HISTORY — PX: COLONOSCOPY: SHX5424

## 2019-10-03 SURGERY — COLONOSCOPY
Anesthesia: Moderate Sedation

## 2019-10-03 MED ORDER — SODIUM CHLORIDE 0.9 % IV SOLN: INTRAVENOUS | Status: DC

## 2019-10-03 MED ORDER — MIDAZOLAM HCL 5 MG/5ML IJ SOLN
INTRAMUSCULAR | Status: DC | PRN
  Administered 2019-10-03 (×2): 2 mg via INTRAVENOUS
  Administered 2019-10-03 (×2): 1 mg via INTRAVENOUS

## 2019-10-03 MED ORDER — STERILE WATER FOR IRRIGATION IR SOLN
Status: DC | PRN
  Administered 2019-10-03: 100 mL

## 2019-10-03 MED ORDER — ONDANSETRON HCL 4 MG/2ML IJ SOLN
INTRAMUSCULAR | Status: AC
  Filled 2019-10-03: qty 2

## 2019-10-03 MED ORDER — ONDANSETRON HCL 4 MG/2ML IJ SOLN
INTRAMUSCULAR | Status: DC | PRN
  Administered 2019-10-03: 4 mg via INTRAVENOUS

## 2019-10-03 MED ORDER — MEPERIDINE HCL 100 MG/ML IJ SOLN
INTRAMUSCULAR | Status: DC | PRN
  Administered 2019-10-03: 15 mg via INTRAVENOUS
  Administered 2019-10-03: 25 mg via INTRAVENOUS

## 2019-10-03 MED ORDER — MIDAZOLAM HCL 5 MG/5ML IJ SOLN
INTRAMUSCULAR | Status: AC
  Filled 2019-10-03: qty 10

## 2019-10-03 MED ORDER — MEPERIDINE HCL 50 MG/ML IJ SOLN
INTRAMUSCULAR | Status: AC
  Filled 2019-10-03: qty 1

## 2019-10-03 NOTE — Discharge Instructions (Signed)
  Colonoscopy Discharge Instructions  Read the instructions outlined below and refer to this sheet in the next few weeks. These discharge instructions provide you with general information on caring for yourself after you leave the hospital. Your doctor may also give you specific instructions. While your treatment has been planned according to the most current medical practices available, unavoidable complications occasionally occur. If you have any problems or questions after discharge, call Dr. Gala Romney at [phone]. ACTIVITY  You may resume your regular activity, but move at a slower pace for the next 24 hours.   Take frequent rest periods for the next 24 hours.   Walking will help get rid of the air and reduce the bloated feeling in your belly (abdomen).   No driving for 24 hours (because of the medicine (anesthesia) used during the test).    Do not sign any important legal documents or operate any machinery for 24 hours (because of the anesthesia used during the test).  NUTRITION  Drink plenty of fluids.   You may resume your normal diet as instructed by your doctor.   Begin with a light meal and progress to your normal diet. Heavy or fried foods are harder to digest and may make you feel sick to your stomach (nauseated).   Avoid alcoholic beverages for 24 hours or as instructed.  MEDICATIONS  You may resume your normal medications unless your doctor tells you otherwise.  WHAT YOU CAN EXPECT TODAY  Some feelings of bloating in the abdomen.   Passage of more gas than usual.   Spotting of blood in your stool or on the toilet paper.  IF YOU HAD POLYPS REMOVED DURING THE COLONOSCOPY:  No aspirin products for 7 days or as instructed.   No alcohol for 7 days or as instructed.   Eat a soft diet for the next 24 hours.  FINDING OUT THE RESULTS OF YOUR TEST Not all test results are available during your visit. If your test results are not back during the visit, make an appointment  with your caregiver to find out the results. Do not assume everything is normal if you have not heard from your caregiver or the medical facility. It is important for you to follow up on all of your test results.  SEEK IMMEDIATE MEDICAL ATTENTION IF:  You have more than a spotting of blood in your stool.   Your belly is swollen (abdominal distention).   You are nauseated or vomiting.   You have a temperature over 101.   You have abdominal pain or discomfort that is severe or gets worse throughout the day.   Your colonoscopy was normal  I recommend a repeat colonoscopy in 5 years  At patient request,  I called [facility] at [phone] -reviewed results

## 2019-10-03 NOTE — H&P (Signed)
@LOGO @   Primary Care Physician:  Beola Cord, FNP Primary Gastroenterologist:  Dr. Gala Romney  Pre-Procedure History & Physical: HPI:  Ronnie Chapman is a 59 y.o. male here for surveillance colonoscopy.  History of multiple adenomas removed with Dr. Oneida Alar 2018; here for surveillance examination.  Past Medical History:  Diagnosis Date  . Hyperlipidemia   . Hypertension     Past Surgical History:  Procedure Laterality Date  . COLONOSCOPY N/A 09/20/2016   Procedure: COLONOSCOPY;  Surgeon: Danie Binder, MD;  [facility];  Service: Endoscopy;  Laterality: N/A;  10:30 AM  . POLYPECTOMY  09/20/2016   Procedure: POLYPECTOMY;  Surgeon: Danie Binder, MD;  Location[facility];  Service: Endoscopy;;  colon    Prior to Admission medications   Medication Sig Start Date End Date Taking? Authorizing Provider  amLODipine (NORVASC) 10 MG tablet Take 10 mg by mouth daily. 08/11/16  Yes [provider]  losartan-hydrochlorothiazide (HYZAAR) 100-25 MG tablet Take 1 tablet by mouth daily.   Yes [provider]  rosuvastatin (CRESTOR) 20 MG tablet Take 20 mg by mouth daily.   Yes [provider]    Allergies as of 07/24/2019  . (No Known Allergies)    Family History  Problem Relation Age of Onset  . Hyperlipidemia Mother     Social History   Socioeconomic History  . Marital status: Single    Spouse name: Not on file  . Number of children: Not on file  . Years of education: Not on file  . Highest education level: Not on file  Occupational History  . Not on file  Tobacco Use  . Smoking status: Current Every Day Smoker    Packs/day: 0.50    Years: 30.00    Pack years: 15.00    Types: Cigarettes  . Smokeless tobacco: Never Used  Vaping Use  . Vaping Use: Never used  Substance and Sexual Activity  . Alcohol use: Yes    Comment: drinks beer and Gin - once a week  . Drug use: No  . Sexual activity: Not on file  Other Topics Concern   . Not on file  Social History Narrative  . Not on file   Social Determinants of Health   Financial Resource Strain:   . Difficulty of Paying Living Expenses:   Food Insecurity:   . Worried About Charity fundraiser in the Last Year:   . Arboriculturist in the Last Year:   Transportation Needs:   . Film/video editor (Medical):   Marland Kitchen Lack of Transportation (Non-Medical):   Physical Activity:   . Days of Exercise per Week:   . Minutes of Exercise per Session:   Stress:   . Feeling of Stress :   Social Connections:   . Frequency of Communication with Friends and Family:   . Frequency of Social Gatherings with Friends and Family:   . Attends Religious Services:   . Active Member of Clubs or Organizations:   . Attends Archivist Meetings:   Marland Kitchen Marital Status:   Intimate Partner Violence:   . Fear of Current or Ex-Partner:   . Emotionally Abused:   Marland Kitchen Physically Abused:   . Sexually Abused:     Review of Systems: See HPI, otherwise negative ROS  Physical Exam: BP 120/81   Pulse 87   Temp 98.3 F (36.8 C) (Oral)   Resp (!) 21   Ht 6' (1.829 m)   SpO2  99%   BMI 29.84 kg/m  General:   Alert,  Well-developed, well-nourished, pleasant and cooperative in NAD Neck:  Supple; no masses or thyromegaly. No significant cervical adenopathy. Lungs:  Clear throughout to auscultation.   No wheezes, crackles, or rhonchi. No acute distress. Heart:  Regular rate and rhythm; no murmurs, clicks, rubs,  or gallops. Abdomen: Non-distended, normal bowel sounds.  Soft and nontender without appreciable mass or hepatosplenomegaly.  Pulses:  Normal pulses noted. Extremities:  Without clubbing or edema.  Impression/Plan: 59 year old gentleman history of multiple colonic adenomas removed 2018; here for surveillance colonoscopy per plan. The risks, benefits, limitations, alternatives and imponderables have been reviewed with the patient. Questions have been answered. All parties are  agreeable.      Notice: This dictation was prepared with Dragon dictation along with smaller phrase technology. Any transcriptional errors that result from this process are unintentional and may not be corrected upon review.

## 2019-10-03 NOTE — Op Note (Signed)
[facility] Patient Name: Ronnie Chapman Procedure Date: 10/03/2019 12:03 PM MRN: [id number] Date of Birth: 1960-05-30 Attending MD: Norvel Richards , MD CSN: [id number] Age: 59 Admit Type: Outpatient Procedure:                Colonoscopy Indications:              High risk colon cancer surveillance: Personal                            history of colonic polyps Providers:                Norvel Richards, MD, Rosina Lowenstein, RN, Raphael Gibney, Technician Referring MD:              Medicines:                Midazolam 6 mg IV, Meperidine 40 mg IV, Ondansetron                            4 mg IV Complications:            No immediate complications. Estimated Blood Loss:     Estimated blood loss: none. Procedure:                Pre-Anesthesia Assessment:                           - Prior to the procedure, a History and Physical                            was performed, and patient medications and                            allergies were reviewed. The patient's tolerance of                            previous anesthesia was also reviewed. The risks                            and benefits of the procedure and the sedation                            options and risks were discussed with the patient.                            All questions were answered, and informed consent                            was obtained. Prior Anticoagulants: The patient has                            taken no previous anticoagulant or antiplatelet  agents. ASA Grade Assessment: II - A patient with                            mild systemic disease. After reviewing the risks                            and benefits, the patient was deemed in                            satisfactory condition to undergo the procedure.                           After obtaining informed consent, the colonoscope                            was passed under direct vision.  Throughout the                            procedure, the patient's blood pressure, pulse, and                            oxygen saturations were monitored continuously. The                            CF-HQ190L ([id number]) scope was introduced through                            the anus and advanced to the the cecum, identified                            by appendiceal orifice and ileocecal valve. The                            colonoscopy was performed without difficulty. The                            patient tolerated the procedure well. The quality                            of the bowel preparation was adequate. The                            ileocecal valve, appendiceal orifice, and rectum                            were photographed. The ileocecal valve, appendiceal                            orifice, and rectum were photographed. The entire                            colon was well visualized. Scope In: 79:89:21 PM Scope Out: 1:13:34 PM Scope Withdrawal Time: 0 hours 11 minutes 11 seconds  Total Procedure Duration:  0 hours 15 minutes 22 seconds  Findings:      The perianal and digital rectal examinations were normal. Somewhat       elongated, capacious colon.      The exam was otherwise without abnormality on direct and retroflexion       views. Impression:               -Capacious colon. The examination was otherwise                            normal on direct and retroflexion views.                           - No specimens collected. Moderate Sedation:      Moderate (conscious) sedation was administered by the endoscopy nurse       and supervised by the endoscopist. The following parameters were       monitored: oxygen saturation, heart rate, blood pressure, and response       to care. Total physician intraservice time was 24 minutes. Recommendation:           - Patient has a contact number available for                            emergencies. The signs and symptoms of potential                             delayed complications were discussed with the                            patient. Return to normal activities tomorrow.                            Written discharge instructions were provided to the                            patient.                           - Resume previous diet.                           - Continue present medications.                           - Repeat colonoscopy in 5 years for surveillance.                           - Return to GI office (date not yet determined). Procedure Code(s):        --- Professional ---                           [phone], Colonoscopy, flexible; diagnostic, including                            collection of specimen(s) by brushing or washing,  when performed (separate procedure)                           [id number], Moderate sedation; each additional 15                            minutes intraservice time                           [id number], Moderate sedation services provided by the                            same physician or other qualified health care                            professional performing a gastrointestinal                            endoscopic service that sedation supports,                            requiring the presence of an independent trained                            observer to assist in the monitoring of the                            patient's level of consciousness and physiological                            status; initial 15 minutes of intra-service time;                            patient age 37 years or older (additional time may                            be reported with [phone], as appropriate) Diagnosis Code(s):        --- Professional ---                           Z86.010, Personal history of colonic polyps CPT copyright 2019 [employer]. All rights reserved. The codes documented in this report are preliminary and upon coder review may  be  revised to meet current compliance requirements. Cristopher Estimable. Azlaan Isidore, MD Norvel Richards, MD 10/03/2019 1:25:00 PM This report has been signed electronically. Number of Addenda: 0

## 2019-10-10 ENCOUNTER — Encounter (HOSPITAL_COMMUNITY): Payer: Self-pay | Admitting: Internal Medicine
